# Patient Record
Sex: Female | Born: 1950
Health system: Southern US, Community
[De-identification: ages and names within clinical notes are randomized; demographics above are authoritative.]

---

## 2002-08-13 ENCOUNTER — Other Ambulatory Visit: Admission: RE | Admit: 2002-08-13 | Discharge: 2002-08-13 | Payer: Self-pay | Admitting: Gynecology

## 2003-09-30 ENCOUNTER — Other Ambulatory Visit: Admission: RE | Admit: 2003-09-30 | Discharge: 2003-09-30 | Payer: Self-pay | Admitting: Gynecology

## 2007-08-23 ENCOUNTER — Encounter: Admission: RE | Admit: 2007-08-23 | Discharge: 2007-08-23 | Payer: Self-pay | Admitting: Emergency Medicine

## 2007-09-06 ENCOUNTER — Other Ambulatory Visit: Admission: RE | Admit: 2007-09-06 | Discharge: 2007-09-06 | Payer: Self-pay | Admitting: Gynecology

## 2007-09-07 ENCOUNTER — Encounter: Admission: RE | Admit: 2007-09-07 | Discharge: 2007-09-07 | Payer: Self-pay | Admitting: Gynecology

## 2007-12-11 ENCOUNTER — Ambulatory Visit: Payer: Self-pay | Admitting: Gastroenterology

## 2007-12-25 ENCOUNTER — Ambulatory Visit: Payer: Self-pay | Admitting: Gastroenterology

## 2008-10-15 IMAGING — CT CT ABDOMEN W/ CM
2 of 5 series · 13 of 32 positions shown, 18 images · IV contrast (READICAT/WATER & [ID] OMNI 300)
Comparison: None.

CLINICAL DATA: Abdominal pain.  Pelvic mass.  Question diverticulitis. 
 ABDOMEN CT WITH CONTRAST:
TECHNIQUE: Multidetector CT imaging of the abdomen was performed following the standard protocol during bolus administration of intravenous contrast.
 Contrast:  80 cc Omnipaque 300
TECHNIQUE: Multidetector CT imaging of the pelvis was performed following the standard protocol during bolus administration of intravenous contrast.

[Series 2: abdomen w/ · axial · 0.77mm/px · z∈[-352,-12]mm · 5 of 102 slices shown, 10 images]
[im 17/102  soft-tissue]
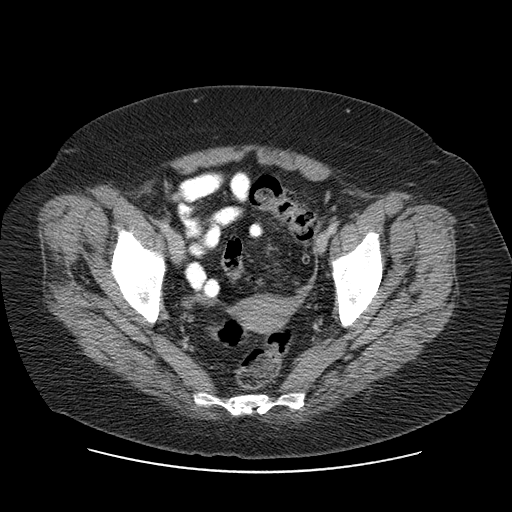
[im 17/102  bone]
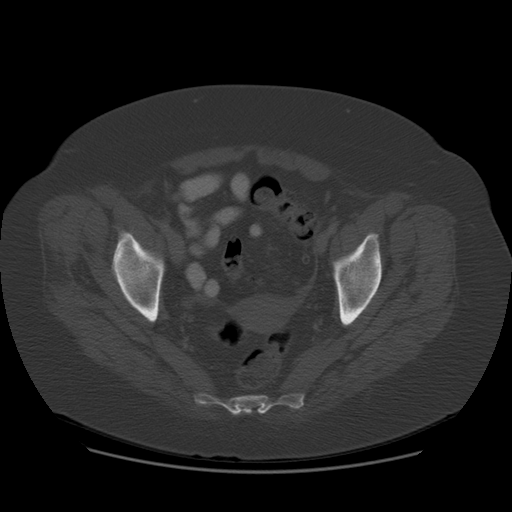
[im 34/102  soft-tissue]
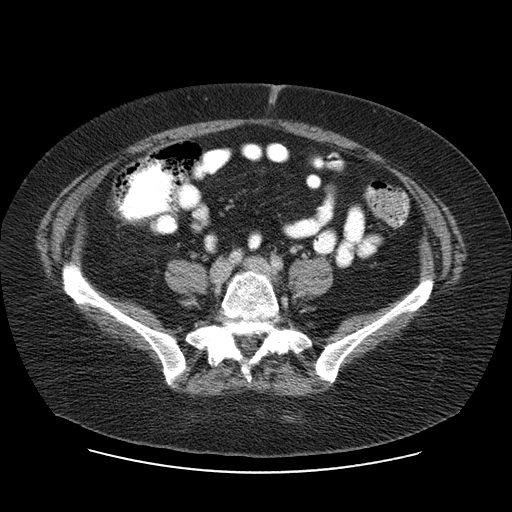
[im 34/102  lung]
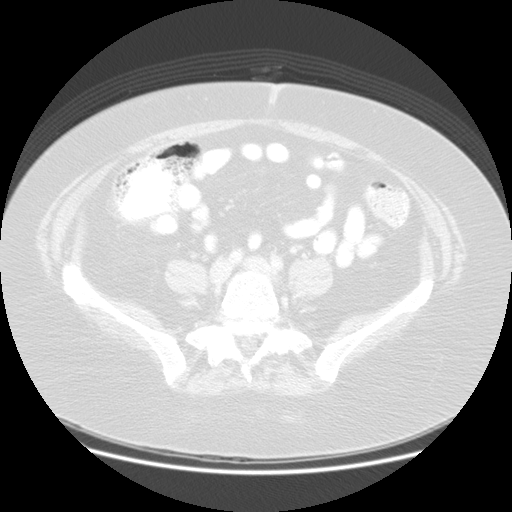
[im 51/102  soft-tissue]
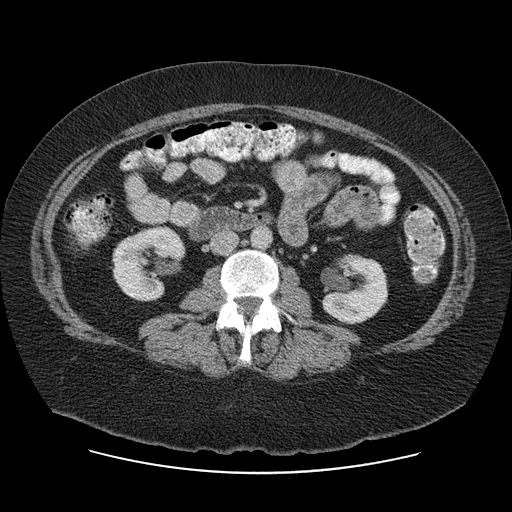
[im 51/102  lung]
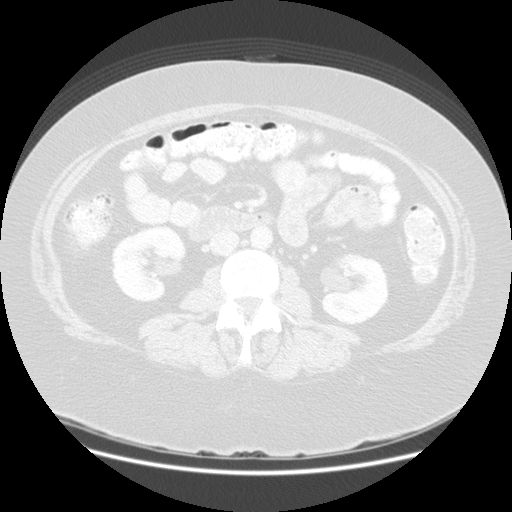
[im 68/102  soft-tissue]
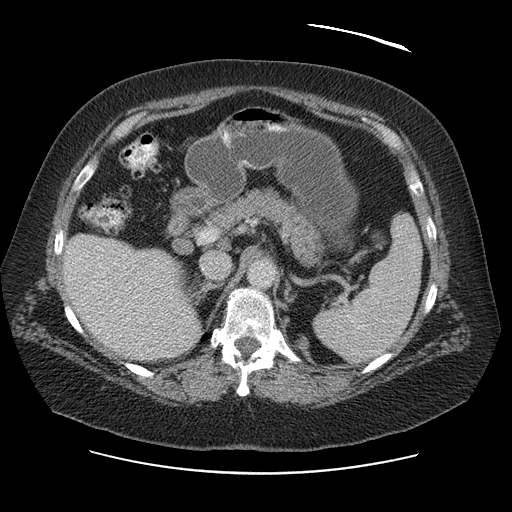
[im 68/102  lung]
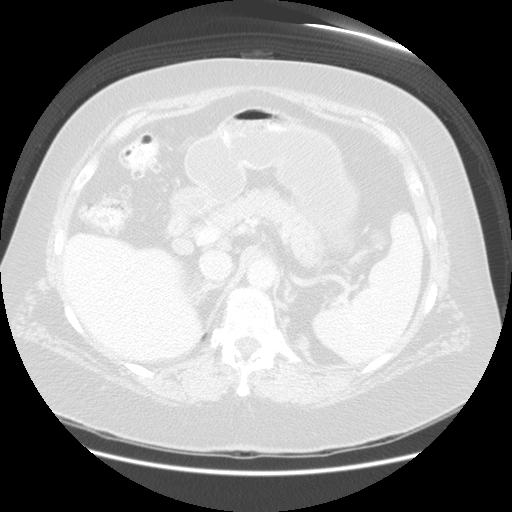
[im 85/102  soft-tissue]
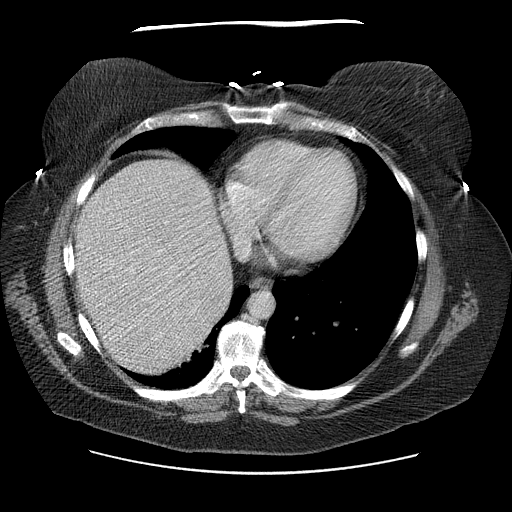
[im 85/102  lung]
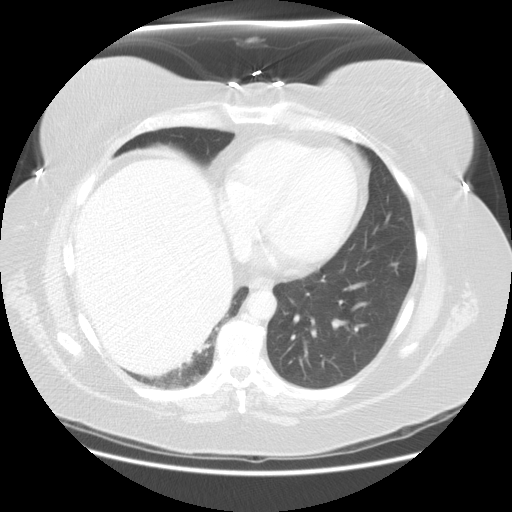

[Series 401: sagittal · sagittal · 1.04mm/px · 8 of 178 slices shown]
[im 17/178  soft-tissue]
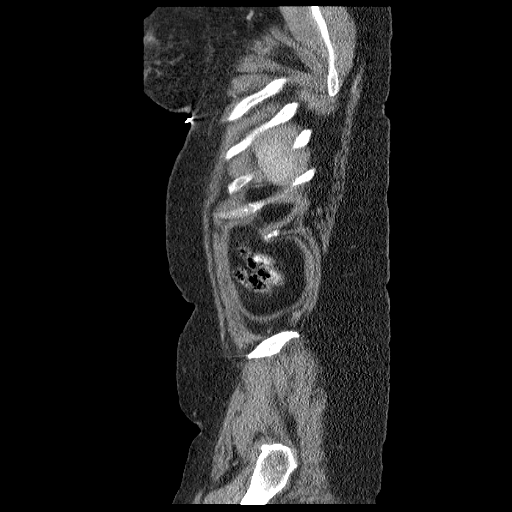
[im 33/178  soft-tissue]
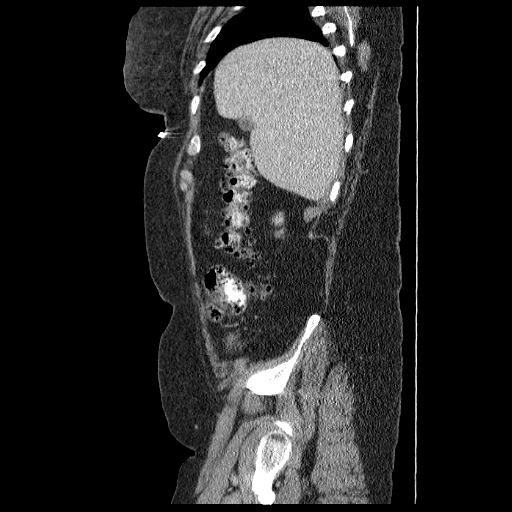
[im 65/178  soft-tissue]
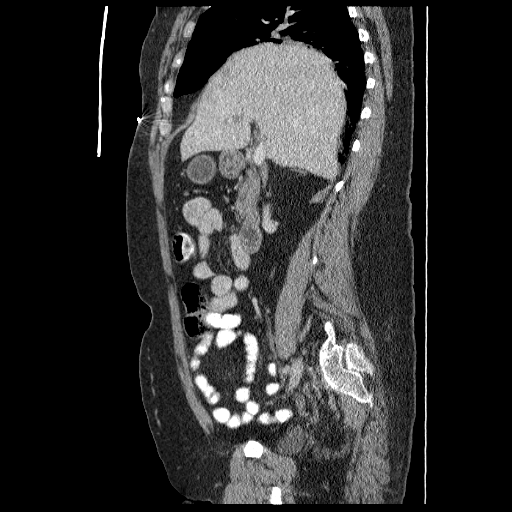
[im 81/178  soft-tissue]
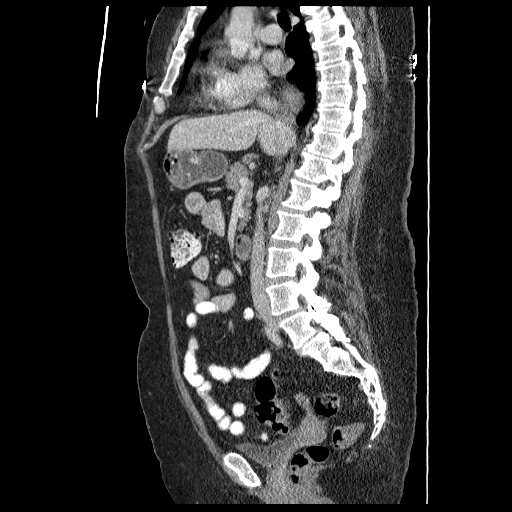
[im 97/178  soft-tissue]
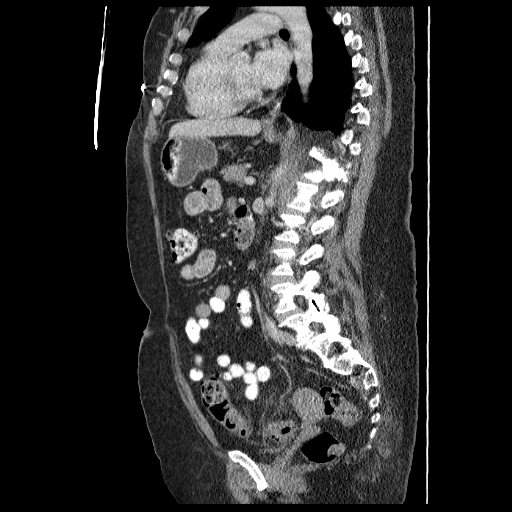
[im 113/178  soft-tissue]
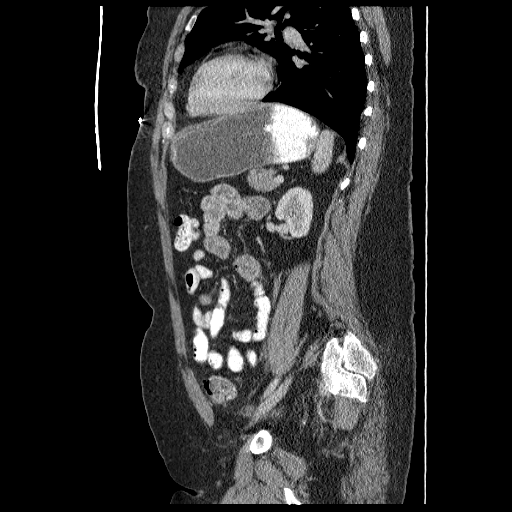
[im 145/178  soft-tissue]
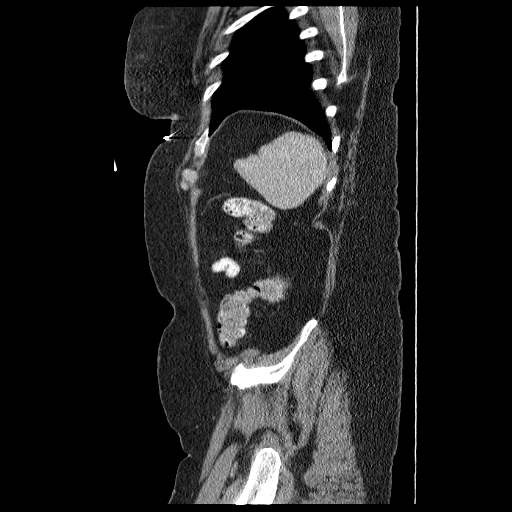
[im 161/178  soft-tissue]
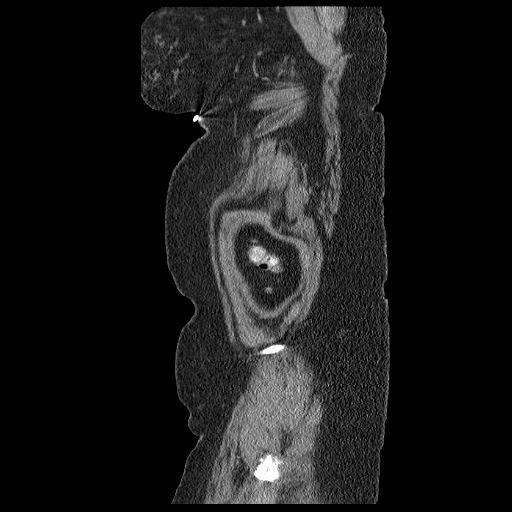

[13 of 32 positions shown; findings below may reference images not displayed]

FINDINGS: There is elevation of the right hemidiaphragm relative to the left with some right basilar atelectasis.  Lung bases are otherwise unremarkable with no pleural or pericardial effusion.  The liver, gallbladder, biliary tree, adrenal glands, spleen, pancreas, and kidneys all appear normal.  The stomach and small bowel have a normal CT appearance.  No abdominal fluid collection or lymphadenopathy.  No focal bony abnormality is identified with advanced facet arthropathy seen at L4-5 and L5-S1.
IMPRESSION: 1.  No acute finding. 
 2.  Degenerative disease lower lumbar spine. 
 PELVIS CT WITH CONTRAST:
FINDINGS: There is infiltration of omental fat about the sigmoid colon consistent with diverticulitis.  No abscess or perforation is identified.  Wall of the sigmoid colon is thickened likely reflecting chronic change.  Colon is otherwise unremarkable with scattered diverticular disease noted.  Appendix appears normal.  Uterus and adnexa are unremarkable.  No lymphadenopathy.  No focal bony abnormality.
IMPRESSION: Study is positive for sigmoid diverticulitis without abscess or perforation.  Recommend followup colon cancer screening after the patient?s acute episode has passed.

## 2016-08-03 DIAGNOSIS — H17821 Peripheral opacity of cornea, right eye: Secondary | ICD-10-CM | POA: Diagnosis not present

## 2016-08-03 DIAGNOSIS — H524 Presbyopia: Secondary | ICD-10-CM | POA: Diagnosis not present

## 2016-08-03 DIAGNOSIS — H40013 Open angle with borderline findings, low risk, bilateral: Secondary | ICD-10-CM | POA: Diagnosis not present

## 2016-08-03 DIAGNOSIS — H52223 Regular astigmatism, bilateral: Secondary | ICD-10-CM | POA: Diagnosis not present

## 2016-08-03 DIAGNOSIS — H5213 Myopia, bilateral: Secondary | ICD-10-CM | POA: Diagnosis not present

## 2016-08-03 DIAGNOSIS — H2513 Age-related nuclear cataract, bilateral: Secondary | ICD-10-CM | POA: Diagnosis not present

## 2017-07-26 DIAGNOSIS — H5213 Myopia, bilateral: Secondary | ICD-10-CM | POA: Diagnosis not present

## 2017-07-26 DIAGNOSIS — H524 Presbyopia: Secondary | ICD-10-CM | POA: Diagnosis not present

## 2017-07-26 DIAGNOSIS — H52203 Unspecified astigmatism, bilateral: Secondary | ICD-10-CM | POA: Diagnosis not present

## 2017-07-26 DIAGNOSIS — H40053 Ocular hypertension, bilateral: Secondary | ICD-10-CM | POA: Diagnosis not present

## 2017-11-06 DIAGNOSIS — R197 Diarrhea, unspecified: Secondary | ICD-10-CM | POA: Diagnosis not present

## 2018-08-01 DIAGNOSIS — H40053 Ocular hypertension, bilateral: Secondary | ICD-10-CM | POA: Diagnosis not present

## 2018-08-01 DIAGNOSIS — H5213 Myopia, bilateral: Secondary | ICD-10-CM | POA: Diagnosis not present

## 2018-08-01 DIAGNOSIS — H524 Presbyopia: Secondary | ICD-10-CM | POA: Diagnosis not present

## 2019-02-23 DIAGNOSIS — K219 Gastro-esophageal reflux disease without esophagitis: Secondary | ICD-10-CM | POA: Diagnosis not present

## 2019-02-23 DIAGNOSIS — R490 Dysphonia: Secondary | ICD-10-CM | POA: Diagnosis not present

## 2019-03-27 DIAGNOSIS — R490 Dysphonia: Secondary | ICD-10-CM | POA: Diagnosis not present

## 2019-03-27 DIAGNOSIS — K219 Gastro-esophageal reflux disease without esophagitis: Secondary | ICD-10-CM | POA: Diagnosis not present

## 2020-03-11 DIAGNOSIS — C44719 Basal cell carcinoma of skin of left lower limb, including hip: Secondary | ICD-10-CM | POA: Diagnosis not present

## 2020-03-11 DIAGNOSIS — L820 Inflamed seborrheic keratosis: Secondary | ICD-10-CM | POA: Diagnosis not present

## 2020-03-11 DIAGNOSIS — D225 Melanocytic nevi of trunk: Secondary | ICD-10-CM | POA: Diagnosis not present

## 2020-05-06 ENCOUNTER — Emergency Department (HOSPITAL_COMMUNITY): Payer: PPO

## 2020-05-06 ENCOUNTER — Encounter (HOSPITAL_COMMUNITY): Payer: Self-pay | Admitting: Emergency Medicine

## 2020-05-06 ENCOUNTER — Emergency Department (HOSPITAL_COMMUNITY)
Admission: EM | Admit: 2020-05-06 | Discharge: 2020-05-07 | Disposition: A | Discharge status: Home / Self Care | Payer: PPO | Attending: Emergency Medicine | Admitting: Emergency Medicine

## 2020-05-06 ENCOUNTER — Other Ambulatory Visit: Payer: Self-pay

## 2020-05-06 ENCOUNTER — Ambulatory Visit (HOSPITAL_COMMUNITY)
Admission: EM | Admit: 2020-05-06 | Discharge: 2020-05-06 | Disposition: A | Discharge status: Other Acute Inpatient Hospital | Payer: Self-pay | Attending: Urgent Care | Admitting: Urgent Care

## 2020-05-06 DIAGNOSIS — U071 COVID-19: Secondary | ICD-10-CM | POA: Diagnosis not present

## 2020-05-06 DIAGNOSIS — Z5321 Procedure and treatment not carried out due to patient leaving prior to being seen by health care provider: Secondary | ICD-10-CM | POA: Insufficient documentation

## 2020-05-06 DIAGNOSIS — R0602 Shortness of breath: Secondary | ICD-10-CM | POA: Diagnosis not present

## 2020-05-06 DIAGNOSIS — J90 Pleural effusion, not elsewhere classified: Secondary | ICD-10-CM | POA: Diagnosis not present

## 2020-05-06 DIAGNOSIS — J189 Pneumonia, unspecified organism: Secondary | ICD-10-CM | POA: Diagnosis not present

## 2020-05-06 DIAGNOSIS — J9811 Atelectasis: Secondary | ICD-10-CM | POA: Diagnosis not present

## 2020-05-06 LAB — COMPREHENSIVE METABOLIC PANEL
ALT: 34 U/L (ref 0–44)
AST: 25 U/L (ref 15–41)
Albumin: 3.3 g/dL — ABNORMAL LOW (ref 3.5–5.0)
Alkaline Phosphatase: 55 U/L (ref 38–126)
Anion gap: 12 (ref 5–15)
BUN: 12 mg/dL (ref 8–23)
CO2: 23 mmol/L (ref 22–32)
Calcium: 9 mg/dL (ref 8.9–10.3)
Chloride: 100 mmol/L (ref 98–111)
Creatinine, Ser: 0.78 mg/dL (ref 0.44–1.00)
GFR calc Af Amer: >60 mL/min (ref >60)
GFR calc non Af Amer: >60 mL/min (ref >60)
Glucose, Bld: 127 mg/dL — ABNORMAL HIGH (ref 70–99)
Potassium: 4.2 mmol/L (ref 3.5–5.1)
Sodium: 135 mmol/L (ref 135–145)
Total Bilirubin: 1 mg/dL (ref 0.3–1.2)
Total Protein: 6.8 g/dL (ref 6.5–8.1)

## 2020-05-06 LAB — CBC WITH DIFFERENTIAL/PLATELET
Abs Immature Granulocytes: 0.04 10*3/uL (ref 0.00–0.07)
Basophils Absolute: 0 10*3/uL (ref 0.0–0.1)
Basophils Relative: 0 %
Eosinophils Absolute: 0 10*3/uL (ref 0.0–0.5)
Eosinophils Relative: 0 %
HCT: 37.2 % (ref 36.0–46.0)
Hemoglobin: 11.9 g/dL — ABNORMAL LOW (ref 12.0–15.0)
Immature Granulocytes: 1 %
Lymphocytes Relative: 11 %
Lymphs Abs: 0.7 10*3/uL (ref 0.7–4.0)
MCH: 29.7 pg (ref 26.0–34.0)
MCHC: 32 g/dL (ref 30.0–36.0)
MCV: 92.8 fL (ref 80.0–100.0)
Monocytes Absolute: 0.6 10*3/uL (ref 0.1–1.0)
Monocytes Relative: 9 %
Neutro Abs: 5.4 10*3/uL (ref 1.7–7.7)
Neutrophils Relative %: 79 %
Platelets: 186 10*3/uL (ref 150–400)
RBC: 4.01 MIL/uL (ref 3.87–5.11)
RDW: 13.5 % (ref 11.5–15.5)
WBC: 6.8 10*3/uL (ref 4.0–10.5)
nRBC: 0 % (ref 0.0–0.2)

## 2020-05-06 LAB — URINALYSIS, ROUTINE W REFLEX MICROSCOPIC
Bacteria, UA: NONE SEEN
Bilirubin Urine: NEGATIVE
Glucose, UA: NEGATIVE mg/dL
Hgb urine dipstick: NEGATIVE
Ketones, ur: NEGATIVE mg/dL
Leukocytes,Ua: NEGATIVE
Nitrite: NEGATIVE
Protein, ur: 30 mg/dL — AB
Specific Gravity, Urine: 1.019 (ref 1.005–1.030)
pH: 5 (ref 5.0–8.0)

## 2020-05-06 LAB — PROTIME-INR
INR: 1.1 (ref 0.8–1.2)
Prothrombin Time: 13.3 seconds (ref 11.4–15.2)

## 2020-05-06 LAB — LACTIC ACID, PLASMA: Lactic Acid, Venous: 1 mmol/L (ref 0.5–1.9)

## 2020-05-06 MED ORDER — ACETAMINOPHEN 325 MG PO TABS: 650.0000 mg | ORAL_TABLET | Freq: Once | ORAL | Status: DC

## 2020-05-06 MED ORDER — ACETAMINOPHEN 325 MG PO TABS
ORAL_TABLET | ORAL | Status: AC
Start: 2020-05-06 — End: ?
  Filled 2020-05-06: qty 2

## 2020-05-06 NOTE — ED Triage Notes (Signed)
Pt presents for shortness of breath developing yesterday related to her COVID infection.  SpO2 88% RA after ambulation, HR 122.

## 2020-05-06 NOTE — ED Notes (Signed)
Patient is being discharged from the Urgent Care and sent to the Emergency Department via private vehicle. Per Jaynee Eagles, APP, patient is in need of higher level of care due to Hypoxia, COVID Infection. Patient is aware and verbalizes understanding of plan of care.  Vitals:   05/06/20 1722  BP: (!) 171/99  Pulse: (!) 129  Resp: 18  Temp: (!) 100.9 F (38.3 C)  SpO2: (!) 88%

## 2020-05-06 NOTE — ED Provider Notes (Signed)
I was asked about this patient at triage.  Patient is febrile, hypoxic and tested positive for COVID-19.  Recommended she be redirected to the emergency room.  RN Joellen Jersey was advised to give the patient Tylenol and then transport her there.   Jaynee Eagles, PA-C 05/06/20 1726

## 2020-05-06 NOTE — ED Triage Notes (Signed)
Pt sent here from UC with worsening shob over the last 2 days. Dx with covid on 8/1.

## 2020-05-11 LAB — CULTURE, BLOOD (ROUTINE X 2)
Culture: NO GROWTH
Special Requests: ADEQUATE

## 2020-08-04 DIAGNOSIS — H40013 Open angle with borderline findings, low risk, bilateral: Secondary | ICD-10-CM | POA: Diagnosis not present

## 2020-08-04 DIAGNOSIS — H2513 Age-related nuclear cataract, bilateral: Secondary | ICD-10-CM | POA: Diagnosis not present

## 2020-08-04 DIAGNOSIS — H524 Presbyopia: Secondary | ICD-10-CM | POA: Diagnosis not present

## 2021-08-10 DIAGNOSIS — H5213 Myopia, bilateral: Secondary | ICD-10-CM | POA: Diagnosis not present

## 2021-08-10 DIAGNOSIS — H2513 Age-related nuclear cataract, bilateral: Secondary | ICD-10-CM | POA: Diagnosis not present

## 2021-08-10 DIAGNOSIS — H40053 Ocular hypertension, bilateral: Secondary | ICD-10-CM | POA: Diagnosis not present
